# Patient Record
Sex: Female | Born: 1955 | Race: Black or African American | Hispanic: No | Marital: Single | Smoking: Former smoker
Health system: Southern US, Community
[De-identification: ages and names within clinical notes are randomized; demographics above are authoritative.]

## PROBLEM LIST (undated history)

## (undated) DIAGNOSIS — E785 Hyperlipidemia, unspecified: Secondary | ICD-10-CM

## (undated) HISTORY — PX: CARDIAC CATHETERIZATION: SHX172

## (undated) HISTORY — PX: MOUTH SURGERY: SHX715

---

## 1999-05-19 ENCOUNTER — Encounter: Payer: Self-pay | Admitting: Emergency Medicine

## 1999-05-19 ENCOUNTER — Emergency Department (HOSPITAL_COMMUNITY): Admission: EM | Admit: 1999-05-19 | Discharge: 1999-05-19 | Payer: Self-pay | Admitting: Emergency Medicine

## 1999-05-25 ENCOUNTER — Encounter: Payer: Self-pay | Admitting: Neurology

## 1999-05-25 ENCOUNTER — Ambulatory Visit (HOSPITAL_COMMUNITY): Admission: RE | Admit: 1999-05-25 | Discharge: 1999-05-25 | Payer: Self-pay | Admitting: Neurology

## 1999-06-10 ENCOUNTER — Encounter: Payer: Self-pay | Admitting: Neurology

## 1999-06-10 ENCOUNTER — Ambulatory Visit (HOSPITAL_COMMUNITY): Admission: RE | Admit: 1999-06-10 | Discharge: 1999-06-10 | Payer: Self-pay | Admitting: Neurology

## 2007-10-13 ENCOUNTER — Ambulatory Visit: Payer: Self-pay | Admitting: Internal Medicine

## 2007-10-14 ENCOUNTER — Ambulatory Visit (HOSPITAL_COMMUNITY): Admission: EM | Admit: 2007-10-14 | Discharge: 2007-10-14 | Payer: Self-pay | Admitting: Emergency Medicine

## 2010-08-04 NOTE — Op Note (Signed)
NAMEJIL, Rachel Gutierrez               ACCOUNT NO.:  [id number]   MEDICAL RECORD NO.:  [id number]          PATIENT TYPE:  AMB   LOCATION:  DAY                           FACILITY:  [facility]   PHYSICIAN:  R. Roetta Sessions, M.D. DATE OF BIRTH:  1955/08/29   DATE OF PROCEDURE:  10/14/2007  DATE OF DISCHARGE:                               OPERATIVE REPORT   INDICATIONS FOR PROCEDURE:  A 55 year old lady with no prior GI history  or symptoms came to the ED to see Dr. Colon Gutierrez this afternoon.  After  eating a salad this morning just finishing her meal noted that one of  the prongs of her plastic fork was missing from the base (this could be  about a 3 cm in length prong).  Although she did not perceive that she  swallowed a foreign body, she certainly could not find the prong  anywhere and she started her meal with an intact fork.  She said some  time after that she began feeling a foreign body sensation pointing to  the base of her neck just below to her suprasternal notch.  She states  its sensations moved around somewhat in this area from left-to-right  during her ED stay.  She was given crackers in the ED.  She has been  able to swallow crackers okay and water.  Plain film of the neck was  negative.  Dr. Colon Gutierrez had recommended a conservative approach.  The  patient was very concerned that she might have a piece of the fork  lodged in her throat.  I came to see Rachel Gutierrez and told her that it  sounds like she may or may not have ingested a piece of this fork and  then she has foreign body sensation.  If we perform an upper endoscopy,  we may not find it, but I have offered her this approach.  Risks,  benefits, and alternatives have been reviewed and questions answered.  She is agreeable.   PROCEDURE NOTE:  O2 saturation, blood pressure, pulse, and respirations  monitored throughout the entire procedure.   CONSCIOUS SEDATION:  Versed 4 mg IV and Demerol 100 mg IV in divided  doses. Cetacaine  spray for topical pharyngeal anesthesia.   INSTRUMENT:  Pentax video chip system.   FINDINGS:  Examination of the hypopharynx:  I got a good look around the  uvula down the piriform sinuses.  There was no foreign body.  This had  been a black prong from a black fork and would have a shown it very  readily.  I did not even seen any evidence of any mucosal trauma.  The  esophagus was easily intubated.  The esophageal mucosa was surveyed.  The patient had a noncritical Schatzki ring, otherwise normal esophagus.  EG junction easily traversed.   Stomach:  Gastric cavity was almost emptied, there was a little mucus  present and a slight amount of vegetable material, otherwise.  It was  emptied and insufflated well with air.  This minimal vegetable material  and mucus was easily washed away.  Thorough examination of gastric  mucosa  including retroflexed view of the proximal stomach and  esophagogastric junction demonstrated only a small hiatal hernia.  Pylorus was patent.  I examined the bulb, the second, and well into the  third portion of duodenum.  I found normal mucosa and no evidence of  foreign body anywhere.   THERAPEUTIC/DIAGNOSTIC MANEUVERS PERFORMED:  None.   The patient tolerated the procedure well and was reactive in Endoscopy.  I did take a photograph of the fork she brought in, indicating a missing  prong with the gastroscope.   IMPRESSION:  1. Normal hypopharynx, noncritical Schatzki ring (not manipulated      because the patient does not have dysphagia), otherwise normal      esophagus, small hiatal hernia.  Otherwise, normal stomach, D1, D2,      and D3.  At this point, the patient may or may not have ingested a      foreign body.  If she does have a fragment of fork prong, it is      well into her small intestine.  At this point, recommendations      hopefully if she has a fork fragment in her small, it will pass      uneventfully.  There is really nothing else to be  done at this      point in time as long she does not have any future symptoms.      Recommendations, would go home and take it easy and would gradually      resume regular diet later today.  2. At a totally separate issue note, Rachel Gutierrez has never had      colorectal cancer screening.  She is 52.  I have recommended she      consider having a screening colonoscopy between now and the end of      the year and she could call my office, I would be glad to set that      up.      Jonathon Bellows, M.D.  Electronically Signed     RMR/MEDQ  D:  10/14/2007  T:  10/14/2007  Job:  [id number]   cc:   Rachel Gutierrez, M.D.  Fax: [phone]

## 2012-09-11 ENCOUNTER — Ambulatory Visit (HOSPITAL_COMMUNITY)
Admission: RE | Admit: 2012-09-11 | Discharge: 2012-09-11 | Disposition: A | Discharge status: Home / Self Care | Payer: PRIVATE HEALTH INSURANCE | Source: Ambulatory Visit | Attending: Family Medicine | Admitting: Family Medicine

## 2012-09-11 ENCOUNTER — Other Ambulatory Visit (HOSPITAL_COMMUNITY): Payer: Self-pay | Admitting: Family Medicine

## 2012-09-11 DIAGNOSIS — Z139 Encounter for screening, unspecified: Secondary | ICD-10-CM

## 2012-09-11 DIAGNOSIS — Z1231 Encounter for screening mammogram for malignant neoplasm of breast: Secondary | ICD-10-CM | POA: Insufficient documentation

## 2012-09-12 ENCOUNTER — Other Ambulatory Visit: Payer: Self-pay | Admitting: Family Medicine

## 2012-09-12 DIAGNOSIS — R928 Other abnormal and inconclusive findings on diagnostic imaging of breast: Secondary | ICD-10-CM

## 2012-10-11 ENCOUNTER — Other Ambulatory Visit: Payer: Self-pay

## 2012-10-11 ENCOUNTER — Telehealth: Payer: Self-pay

## 2012-10-11 DIAGNOSIS — Z1211 Encounter for screening for malignant neoplasm of colon: Secondary | ICD-10-CM

## 2012-10-18 NOTE — Telephone Encounter (Signed)
Gastroenterology Pre-Procedure Review  Request Date: 10/17/2012 Requesting Physician: Dr. Janna Arch  PATIENT REVIEW QUESTIONS: The patient responded to the following health history questions as indicated:    1. Diabetes Melitis: no 2. Joint replacements in the past 12 months: no 3. Major health problems in the past 3 months: no 4. Has an artificial valve or MVP: no 5. Has a defibrillator: no 6. Has been advised in past to take antibiotics in advance of a procedure like teeth cleaning: no    MEDICATIONS & ALLERGIES:    Patient reports the following regarding taking any blood thinners:   Plavix? no Aspirin? no Coumadin? no  Patient confirms/reports the following medications:  Current Outpatient Prescriptions  Medication Sig Dispense Refill  . diphenhydrAMINE (BENADRYL) 25 MG tablet Take 25 mg by mouth every 6 (six) hours as needed for itching.      . pravastatin (PRAVACHOL) 40 MG tablet Take 40 mg by mouth daily.       No current facility-administered medications for this visit.    Patient confirms/reports the following allergies:  No Known Allergies  No orders of the defined types were placed in this encounter.    AUTHORIZATION INFORMATION Primary Insurance:   ID #:   Group #:  Pre-Cert / Auth required:  Pre-Cert / Auth #:   Secondary Insurance:   ID #:   Group #:  Pre-Cert / Auth required:  Pre-Cert / Auth #:   SCHEDULE INFORMATION: Procedure has been scheduled as follows:  Date: 11/06/2012        Time:  7:30 AM Location: [facility] Short Stay  This Gastroenterology Pre-Precedure Review Form is being routed to the following provider(s): R. Roetta Sessions, MD

## 2012-10-19 NOTE — Telephone Encounter (Signed)
Appropriate.

## 2012-10-20 ENCOUNTER — Encounter (HOSPITAL_COMMUNITY): Payer: Self-pay

## 2012-10-23 MED ORDER — PEG-KCL-NACL-NASULF-NA ASC-C 100 G PO SOLR: 1.0000 | ORAL | Status: DC

## 2012-10-23 NOTE — Telephone Encounter (Signed)
Rx sent to the pharmacy and instructions mailed to pt.  

## 2012-10-30 ENCOUNTER — Telehealth: Payer: Self-pay

## 2012-10-30 NOTE — Telephone Encounter (Signed)
Called [employer] at [phone] and spoke to [employer] who said that pre-cert is required. The PA # [id number]. This is for TCS scheduled with RMR on 11/06/2012 at 7:30 AM. ( she did say that if the date changes that we will have to call them back and get new number.)

## 2012-11-06 ENCOUNTER — Ambulatory Visit (HOSPITAL_COMMUNITY)
Admission: RE | Admit: 2012-11-06 | Discharge: 2012-11-06 | Disposition: A | Discharge status: Home / Self Care | Payer: PRIVATE HEALTH INSURANCE | Source: Ambulatory Visit | Attending: Internal Medicine | Admitting: Internal Medicine

## 2012-11-06 ENCOUNTER — Encounter (HOSPITAL_COMMUNITY)
Admission: RE | Disposition: A | Discharge status: Home / Self Care | Payer: Self-pay | Source: Ambulatory Visit | Attending: Internal Medicine

## 2012-11-06 ENCOUNTER — Encounter (HOSPITAL_COMMUNITY): Payer: Self-pay | Admitting: *Deleted

## 2012-11-06 DIAGNOSIS — Z1211 Encounter for screening for malignant neoplasm of colon: Secondary | ICD-10-CM

## 2012-11-06 DIAGNOSIS — D126 Benign neoplasm of colon, unspecified: Secondary | ICD-10-CM | POA: Insufficient documentation

## 2012-11-06 DIAGNOSIS — E785 Hyperlipidemia, unspecified: Secondary | ICD-10-CM | POA: Insufficient documentation

## 2012-11-06 DIAGNOSIS — Z87891 Personal history of nicotine dependence: Secondary | ICD-10-CM | POA: Insufficient documentation

## 2012-11-06 DIAGNOSIS — K5730 Diverticulosis of large intestine without perforation or abscess without bleeding: Secondary | ICD-10-CM | POA: Insufficient documentation

## 2012-11-06 DIAGNOSIS — Z79899 Other long term (current) drug therapy: Secondary | ICD-10-CM | POA: Insufficient documentation

## 2012-11-06 HISTORY — DX: Hyperlipidemia, unspecified: E78.5

## 2012-11-06 HISTORY — PX: COLONOSCOPY: SHX5424

## 2012-11-06 SURGERY — COLONOSCOPY
Anesthesia: Moderate Sedation

## 2012-11-06 MED ORDER — MIDAZOLAM HCL 5 MG/5ML IJ SOLN
INTRAMUSCULAR | Status: AC
  Filled 2012-11-06: qty 10

## 2012-11-06 MED ORDER — MEPERIDINE HCL 100 MG/ML IJ SOLN
INTRAMUSCULAR | Status: AC
  Filled 2012-11-06: qty 2

## 2012-11-06 MED ORDER — STERILE WATER FOR IRRIGATION IR SOLN
Status: DC | PRN
  Administered 2012-11-06: 08:00:00

## 2012-11-06 MED ORDER — MEPERIDINE HCL 100 MG/ML IJ SOLN
INTRAMUSCULAR | Status: DC | PRN
  Administered 2012-11-06: 50 mg via INTRAVENOUS
  Administered 2012-11-06: 25 mg via INTRAVENOUS

## 2012-11-06 MED ORDER — ONDANSETRON HCL 4 MG/2ML IJ SOLN
INTRAMUSCULAR | Status: AC
  Filled 2012-11-06: qty 2

## 2012-11-06 MED ORDER — SODIUM CHLORIDE 0.9 % IV SOLN
INTRAVENOUS | Status: DC
  Administered 2012-11-06: 1000 mL via INTRAVENOUS

## 2012-11-06 MED ORDER — MIDAZOLAM HCL 5 MG/5ML IJ SOLN
INTRAMUSCULAR | Status: DC | PRN
  Administered 2012-11-06: 2 mg via INTRAVENOUS
  Administered 2012-11-06: 1 mg via INTRAVENOUS

## 2012-11-06 MED ORDER — ONDANSETRON HCL 4 MG/2ML IJ SOLN
INTRAMUSCULAR | Status: DC | PRN
  Administered 2012-11-06: 4 mg via INTRAVENOUS

## 2012-11-06 NOTE — H&P (Signed)
Primary Care Physician:  Isabella Stalling, MD Primary Gastroenterologist:  Dr. Jena Gauss  Pre-Procedure History & Physical: HPI:  Rachel Gutierrez is a 57 y.o. female is here for a screening colonoscopy. No bowel symptoms. No prior colonoscopy. No family history colon polyps or colon cancer.  Past Medical History  Diagnosis Date  . Hyperlipemia     Past Surgical History  Procedure Laterality Date  . Mouth surgery      30 yrs ago  . Cardiac catheterization      20 yrs ago    Prior to Admission medications   Medication Sig Start Date End Date Taking? Authorizing Provider  peg 3350 powder (MOVIPREP) 100 G SOLR Take 1 kit (200 g total) by mouth as directed. 10/23/12  Yes Corbin Ade, MD  pravastatin (PRAVACHOL) 40 MG tablet Take 40 mg by mouth daily.   Yes Historical Provider, MD  diphenhydrAMINE (BENADRYL) 25 MG tablet Take 25 mg by mouth daily as needed for allergies.     Historical Provider, MD    Allergies as of 10/11/2012  . (Not on File)    Family History  Problem Relation Age of Onset  . Cancer - Other Mother   . Thyroid disease Other   . Stroke Maternal Uncle   . CAD Maternal Uncle     History   Social History  . Marital Status: Single    Spouse Name: N/A    Number of Children: N/A  . Years of Education: N/A   Occupational History  . Not on file.   Social History Main Topics  . Smoking status: Former Smoker -- 0.25 packs/day for 2 years    Types: Cigarettes    Quit date: 03/22/1978  . Smokeless tobacco: Never Used  . Alcohol Use: Yes     Comment: social  . Drug Use: No  . Sexual Activity: Not Currently   Other Topics Concern  . Not on file   Social History Narrative  . No narrative on file    Review of Systems: See HPI, otherwise negative ROS  Physical Exam: BP 117/74  Pulse 75  Temp(Src) 98.5 F (36.9 C) (Oral)  Resp 20  Ht 5\' 5"  (1.651 m)  Wt 180 lb (81.647 kg)  BMI 29.95 kg/m2  SpO2 93% General:   Alert,  Well-developed,  well-nourished, pleasant and cooperative in NAD Head:  Normocephalic and atraumatic. Eyes:  Sclera clear, no icterus.   Conjunctiva pink. Ears:  Normal auditory acuity. Nose:  No deformity, discharge,  or lesions. Mouth:  No deformity or lesions, dentition normal. Neck:  Supple; no masses or thyromegaly. Lungs:  Clear throughout to auscultation.   No wheezes, crackles, or rhonchi. No acute distress. Heart:  Regular rate and rhythm; no murmurs, clicks, rubs,  or gallops. Abdomen:  Soft, nontender and nondistended. No masses, hepatosplenomegaly or hernias noted. Normal bowel sounds, without guarding, and without rebound.   Msk:  Symmetrical without gross deformities. Normal posture. Pulses:  Normal pulses noted. Extremities:  Without clubbing or edema. Neurologic:  Alert and  oriented x4;  grossly normal neurologically. Skin:  Intact without significant lesions or rashes. Cervical Nodes:  No significant cervical adenopathy. Psych:  Alert and cooperative. Normal mood and affect.  Impression/Plan: Rachel Gutierrez is now here to undergo a screening colonoscopy.  First-ever average risk screening examination  Risks, benefits, limitations, imponderables and alternatives regarding colonoscopy have been reviewed with the patient. Questions have been answered. All parties agreeable.

## 2012-11-06 NOTE — Op Note (Signed)
[facility] [address] [place], [address]   COLONOSCOPY PROCEDURE REPORT  PATIENT: Rachel, Gutierrez  MR#:         [id number] BIRTHDATE: [birth year] , 57  yrs. old GENDER: Female ENDOSCOPIST: R.  Roetta Sessions, MD FACP [facility] REFERRED BY:  Oval Linsey, M.D. PROCEDURE DATE:  11/06/2012 PROCEDURE:     Colonoscopy with snare polypectomy INDICATIONS: First-ever average risk screening colonoscopy  INFORMED CONSENT:  The risks, benefits, alternatives and imponderables including but not limited to bleeding, perforation as well as the possibility of a missed lesion have been reviewed.  The potential for biopsy, lesion removal, etc. have also been discussed.  Questions have been answered.  All parties agreeable. Please see the history and physical in the medical record for more information.  MEDICATIONS: Versed 3 mg IV and Demerol 75 mg IV in divided doses. Zofran 4 mg  DESCRIPTION OF PROCEDURE:  After a digital rectal exam was performed, the EC-3890Li ([id number])  colonoscope was advanced from the anus through the rectum and colon to the area of the cecum, ileocecal valve and appendiceal orifice.  The cecum was deeply intubated.  These structures were well-seen and photographed for the record.  From the level of the cecum and ileocecal valve, the scope was slowly and cautiously withdrawn.  The mucosal surfaces were carefully surveyed utilizing scope tip deflection to facilitate fold flattening as needed.  The scope was pulled down into the rectum where a thorough examination including retroflexion was performed.    FINDINGS:  Adequate preparation. Normal rectum. Scattered left-sided diverticula; (1)  7x7 mm area of serrated, polypoid mucosal on the distal side of the ileocecal valve; otherwise, the remainder of colonic mucosa appeared normal.  THERAPEUTIC / DIAGNOSTIC MANEUVERS PERFORMED:  The above-mentioned polyp was removed with hot snare cautery  technique  COMPLICATIONS: None  CECAL WITHDRAWAL TIME:  13 minutes  IMPRESSION:  polyp-ileocecal valve-removed as described above. Colonic diverticulosis  RECOMMENDATIONS: Further recommendations to follow pending review of pathology report   _______________________________ eSigned:  R. Roetta Sessions, MD FACP [facility] 11/06/2012 8:18 AM   CC:

## 2012-11-07 ENCOUNTER — Encounter (HOSPITAL_COMMUNITY): Payer: Self-pay | Admitting: Internal Medicine

## 2012-11-07 ENCOUNTER — Encounter: Payer: Self-pay | Admitting: Internal Medicine

## 2015-09-29 ENCOUNTER — Encounter: Payer: Self-pay | Admitting: Internal Medicine

## 2017-01-12 ENCOUNTER — Other Ambulatory Visit: Payer: Self-pay | Admitting: Family Medicine

## 2017-01-12 DIAGNOSIS — Z1231 Encounter for screening mammogram for malignant neoplasm of breast: Secondary | ICD-10-CM

## 2017-01-14 ENCOUNTER — Ambulatory Visit (HOSPITAL_COMMUNITY)
Admission: RE | Admit: 2017-01-14 | Discharge: 2017-01-14 | Disposition: A | Discharge status: Home / Self Care | Payer: PRIVATE HEALTH INSURANCE | Source: Ambulatory Visit | Attending: Family Medicine | Admitting: Family Medicine

## 2017-01-14 DIAGNOSIS — Z1231 Encounter for screening mammogram for malignant neoplasm of breast: Secondary | ICD-10-CM | POA: Insufficient documentation

## 2017-01-31 ENCOUNTER — Encounter: Payer: Self-pay | Admitting: Family Medicine

## 2017-01-31 ENCOUNTER — Other Ambulatory Visit: Payer: Self-pay

## 2017-01-31 ENCOUNTER — Ambulatory Visit: Payer: PRIVATE HEALTH INSURANCE | Admitting: Family Medicine

## 2017-01-31 VITALS — BP 132/80 | HR 100 | Temp 97.8°F | Resp 16 | Ht 65.0 in | Wt 194.1 lb

## 2017-01-31 DIAGNOSIS — E559 Vitamin D deficiency, unspecified: Secondary | ICD-10-CM

## 2017-01-31 DIAGNOSIS — E785 Hyperlipidemia, unspecified: Secondary | ICD-10-CM | POA: Insufficient documentation

## 2017-01-31 DIAGNOSIS — Z1159 Encounter for screening for other viral diseases: Secondary | ICD-10-CM | POA: Diagnosis not present

## 2017-01-31 DIAGNOSIS — E663 Overweight: Secondary | ICD-10-CM | POA: Diagnosis not present

## 2017-01-31 DIAGNOSIS — D126 Benign neoplasm of colon, unspecified: Secondary | ICD-10-CM | POA: Diagnosis not present

## 2017-01-31 NOTE — Patient Instructions (Addendum)
Need blood tests Need old records Continue good diet and exercise habits  Come back for a PAP and PE next month  work note

## 2017-01-31 NOTE — Progress Notes (Signed)
Chief Complaint  Patient presents with  . Follow-up    est. care  Patient is new to establish.  She states she has no ongoing medical problems. She had a history of hyperlipidemia and previously took pravastatin.  She took herself off of this medication because she did not like taking it.  She does not recall that she had any difficulty with the medication.  She does not recall when her lipids were last checked. No hypertension or heart disease.  She did have a cardiac catheterization one time she states for "fainting spell".  She tells me that it was normal She is on no prescription medicine.  She takes no nutritional supplements or vitamins. She is up-to-date with her mammogram.  She has not had a Pap smear in many years.  She agrees that it is time to get this done.  She is not currently sexually active. She had a colonoscopy in 2014.  She had an adenomatous polyp.  She is due again in 5 years. We discussed the need for hepatitis C screening and all persons born between [place].  She is low risk but agrees to have this done. She refuses a flu shot.  She cares for an aging relative.  I discussed with her the importance of getting a flu shot both to protect herself and her left lungs.  She will "think about it".  She states her tetanus is up-to-date.    Patient Active Problem List   Diagnosis Date Noted  . HLD (hyperlipidemia) 01/31/2017  . Overweight (BMI 25.0-29.9) 01/31/2017  . Colon adenoma 01/31/2017  . Encounter for hepatitis C screening test for low risk patient 01/31/2017    Outpatient Encounter Medications as of 01/31/2017  None No facility-administered encounter medications on file as of 01/31/2017.     Past Medical History:  Diagnosis Date  . Hyperlipemia     Past Surgical History:  Procedure Laterality Date  . CARDIAC CATHETERIZATION     20 yrs ago  . MOUTH SURGERY     30 yrs ago    Social History   Socioeconomic History  . Marital status: Single   Spouse name: Not on file  . Number of children: 0  . Years of education: 89  . Highest education level: Bachelor's degree (e.g., BA, AB, BS)  Social Needs  . Financial resource strain: Not hard at all  . Food insecurity - worry: Never true  . Food insecurity - inability: Never true  . Transportation needs - medical: No  . Transportation needs - non-medical: No  Occupational History  . Occupation: Estate manager/land agent  Tobacco Use  . Smoking status: Former Smoker    Packs/day: 0.25    Years: 2.00    Pack years: 0.50    Types: Cigarettes    Last attempt to quit: 03/22/1978    Years since quitting: 38.8  . Smokeless tobacco: Never Used  Substance and Sexual Activity  . Alcohol use: Yes    Comment: social  . Drug use: No  . Sexual activity: Not Currently  Other Topics Concern  . 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 Cancer Mother        pancreatic  . Heart disease Father   . Thyroid disease Other   .  Stroke Maternal Uncle   . CAD Maternal Uncle   . Cancer Maternal Uncle        esophageal    Review of Systems  Constitutional: Negative for chills, fever and weight loss.  HENT: Negative for congestion and hearing loss.   Eyes: Negative for blurred vision and pain.  Respiratory: Negative for cough and shortness of breath.   Cardiovascular: Negative for chest pain and leg swelling.  Gastrointestinal: Negative for abdominal pain, constipation, diarrhea and heartburn.  Genitourinary: Negative for dysuria and frequency.  Musculoskeletal: Negative for falls, joint pain and myalgias.  Neurological: Negative for dizziness, seizures and headaches.  Psychiatric/Behavioral: Negative for depression. The patient is not nervous/anxious and does not have insomnia.     BP 132/80   Pulse 100   Temp 97.8 F (36.6 C)  (Temporal)   Resp 16   Ht 5\' 5"  (1.651 m)   Wt 194 lb 1.9 oz (88.1 kg)   SpO2 97%   BMI 32.30 kg/m   Physical Exam  Constitutional: She is oriented to person, place, and time. She appears well-developed and well-nourished.  HENT:  Head: Normocephalic and atraumatic.  Mouth/Throat: Oropharynx is clear and moist.  Eyes: Conjunctivae are normal. Pupils are equal, round, and reactive to light.  Neck: Normal range of motion. Neck supple. No thyromegaly present.  Cardiovascular: Normal rate, regular rhythm and normal heart sounds.  Pulmonary/Chest: Effort normal and breath sounds normal. No respiratory distress.  Abdominal: Soft. Bowel sounds are normal.  Musculoskeletal: Normal range of motion. She exhibits no edema.  Lymphadenopathy:    She has no cervical adenopathy.  Neurological: She is alert and oriented to person, place, and time.  Gait normal  Skin: Skin is warm and dry.  Psychiatric: She has a normal mood and affect. Her behavior is normal. Thought content normal.  Nursing note and vitals reviewed.  ASSESSMENT/PLAN:  1. Hyperlipidemia, unspecified hyperlipidemia type By history.  Will check lipid panel - CBC - Comprehensive metabolic panel - Lipid panel - Urinalysis, Routine w reflex microscopic - Hepatitis C antibody  2. Overweight (BMI 25.0-29.9) Discussed diet, exercise recommendations  3. Colon adenoma By history.  4. Encounter for hepatitis C screening test for low risk patient Low risk - Hepatitis C antibody  5. Vitamin D deficiency By history.  Will recheck level - VITAMIN D 25 Hydroxy (Vit-D Deficiency, Fractures)   Patient Instructions  Need blood tests Need old records Continue good diet and exercise habits  Come back for a PAP and PE next month  work note   Raylene Everts, MD

## 2017-02-01 LAB — LIPID PANEL
Cholesterol: 223 mg/dL — ABNORMAL HIGH (ref <200)
HDL: 52 mg/dL (ref >50)
LDL CHOLESTEROL (CALC): 149 mg/dL — AB
NON-HDL CHOLESTEROL (CALC): 171 mg/dL — AB (ref <130)
TRIGLYCERIDES: 104 mg/dL (ref <150)
Total CHOL/HDL Ratio: 4.3 (calc) (ref <5.0)

## 2017-02-01 LAB — CBC
HCT: 39.3 % (ref 35.0–45.0)
Hemoglobin: 13.6 g/dL (ref 11.7–15.5)
MCH: 29.8 pg (ref 27.0–33.0)
MCHC: 34.6 g/dL (ref 32.0–36.0)
MCV: 86 fL (ref 80.0–100.0)
MPV: 10.2 fL (ref 7.5–12.5)
Platelets: 400 10*3/uL (ref 140–400)
RBC: 4.57 10*6/uL (ref 3.80–5.10)
RDW: 12.9 % (ref 11.0–15.0)
WBC: 6.4 10*3/uL (ref 3.8–10.8)

## 2017-02-01 LAB — COMPREHENSIVE METABOLIC PANEL
AG RATIO: 1.2 (calc) (ref 1.0–2.5)
ALBUMIN MSPROF: 4.3 g/dL (ref 3.6–5.1)
ALT: 30 U/L — AB (ref 6–29)
AST: 17 U/L (ref 10–35)
Alkaline phosphatase (APISO): 84 U/L (ref 33–130)
BUN: 12 mg/dL (ref 7–25)
CO2: 29 mmol/L (ref 20–32)
Calcium: 9.9 mg/dL (ref 8.6–10.4)
Chloride: 104 mmol/L (ref 98–110)
Creat: 0.81 mg/dL (ref 0.50–1.05)
GLOBULIN: 3.5 g/dL (ref 1.9–3.7)
GLUCOSE: 107 mg/dL — AB (ref 65–99)
POTASSIUM: 4.6 mmol/L (ref 3.5–5.3)
SODIUM: 140 mmol/L (ref 135–146)
TOTAL PROTEIN: 7.8 g/dL (ref 6.1–8.1)
Total Bilirubin: 0.4 mg/dL (ref 0.2–1.2)

## 2017-02-01 LAB — URINALYSIS, ROUTINE W REFLEX MICROSCOPIC
Bilirubin Urine: NEGATIVE
Glucose, UA: NEGATIVE
Hgb urine dipstick: NEGATIVE
KETONES UR: NEGATIVE
Leukocytes, UA: NEGATIVE
NITRITE: NEGATIVE
PH: 5.5 (ref 5.0–8.0)
Protein, ur: NEGATIVE
SPECIFIC GRAVITY, URINE: 1.021 (ref 1.001–1.03)

## 2017-02-01 LAB — HEPATITIS C ANTIBODY
Hepatitis C Ab: NONREACTIVE
SIGNAL TO CUT-OFF: 0.04 (ref <1.00)

## 2017-02-01 LAB — VITAMIN D 25 HYDROXY (VIT D DEFICIENCY, FRACTURES): Vit D, 25-Hydroxy: 15 ng/mL — ABNORMAL LOW (ref 30–100)

## 2017-02-03 ENCOUNTER — Encounter: Payer: Self-pay | Admitting: Family Medicine

## 2017-02-14 ENCOUNTER — Other Ambulatory Visit (HOSPITAL_COMMUNITY)
Admission: RE | Admit: 2017-02-14 | Discharge: 2017-02-14 | Disposition: A | Discharge status: Home / Self Care | Payer: PRIVATE HEALTH INSURANCE | Source: Ambulatory Visit | Attending: Family Medicine | Admitting: Family Medicine

## 2017-02-14 ENCOUNTER — Ambulatory Visit (INDEPENDENT_AMBULATORY_CARE_PROVIDER_SITE_OTHER): Payer: PRIVATE HEALTH INSURANCE | Admitting: Family Medicine

## 2017-02-14 ENCOUNTER — Other Ambulatory Visit: Payer: Self-pay

## 2017-02-14 ENCOUNTER — Encounter: Payer: Self-pay | Admitting: Family Medicine

## 2017-02-14 VITALS — BP 132/82 | HR 84 | Temp 97.9°F | Resp 18 | Ht 65.0 in | Wt 195.0 lb

## 2017-02-14 DIAGNOSIS — Z0000 Encounter for general adult medical examination without abnormal findings: Secondary | ICD-10-CM

## 2017-02-14 DIAGNOSIS — Z1159 Encounter for screening for other viral diseases: Secondary | ICD-10-CM | POA: Diagnosis not present

## 2017-02-14 DIAGNOSIS — E785 Hyperlipidemia, unspecified: Secondary | ICD-10-CM | POA: Diagnosis not present

## 2017-02-14 DIAGNOSIS — L918 Other hypertrophic disorders of the skin: Secondary | ICD-10-CM | POA: Diagnosis not present

## 2017-02-14 DIAGNOSIS — E559 Vitamin D deficiency, unspecified: Secondary | ICD-10-CM | POA: Insufficient documentation

## 2017-02-14 DIAGNOSIS — Z6832 Body mass index (BMI) 32.0-32.9, adult: Secondary | ICD-10-CM | POA: Insufficient documentation

## 2017-02-14 DIAGNOSIS — Z124 Encounter for screening for malignant neoplasm of cervix: Secondary | ICD-10-CM | POA: Diagnosis not present

## 2017-02-14 DIAGNOSIS — E663 Overweight: Secondary | ICD-10-CM | POA: Insufficient documentation

## 2017-02-14 DIAGNOSIS — Z1151 Encounter for screening for human papillomavirus (HPV): Secondary | ICD-10-CM | POA: Insufficient documentation

## 2017-02-14 NOTE — Progress Notes (Signed)
Chief Complaint  Patient presents with  . Annual Exam   Patient is here for an annual physical examination. She has no acute complaints today.  Last Pap was several years ago it was normal.  She is getting a physical and Pap today. We reviewed her most recent blood work. She has mild hyperlipidemia.  She has vitamin D deficiency. Diet and exercise are reviewed.  Vitamin D supplementation is discussed.  Dietary sources of vitamin D and cholesterol are reviewed.  Regular exercise is recommended. Mammogram is up-to-date.  Colonoscopy is up-to-date. We discussed the CDC recommendations for hepatitis C screening.  She desires screening.  She is a low risk individual.   Patient Active Problem List   Diagnosis Date Noted  . HLD (hyperlipidemia) 01/31/2017  . Overweight (BMI 25.0-29.9) 01/31/2017  . Colon adenoma 01/31/2017  . Encounter for hepatitis C screening test for low risk patient 01/31/2017    No outpatient encounter medications on file as of 02/14/2017.   No facility-administered encounter medications on file as of 02/14/2017.   No prescription medications  No Known Allergies  Review of Systems  Constitutional: Negative for activity change, appetite change and unexpected weight change.  HENT: Negative for congestion, dental problem, postnasal drip and rhinorrhea.   Eyes: Negative for redness and visual disturbance.  Respiratory: Negative for cough and shortness of breath.   Cardiovascular: Negative for chest pain, palpitations and leg swelling.  Gastrointestinal: Negative for abdominal pain, constipation and diarrhea.  Genitourinary: Negative for difficulty urinating, frequency and vaginal bleeding.  Musculoskeletal: Negative for arthralgias and back pain.  Neurological: Negative for dizziness and headaches.  Psychiatric/Behavioral: Negative for dysphoric mood and sleep disturbance. The patient is not nervous/anxious.     BP 132/82 (BP Location: Right Arm, Patient  Position: Sitting, Cuff Size: Normal)   Pulse 84   Temp 97.9 F (36.6 C) (Temporal)   Resp 18   Ht 5\' 5"  (1.651 m)   Wt 195 lb 0.6 oz (88.5 kg)   SpO2 95%   BMI 32.46 kg/m    Physical Exam BP 132/82 (BP Location: Right Arm, Patient Position: Sitting, Cuff Size: Normal)   Pulse 84   Temp 97.9 F (36.6 C) (Temporal)   Resp 18   Ht 5\' 5"  (1.651 m)   Wt 195 lb 0.6 oz (88.5 kg)   SpO2 95%   BMI 32.46 kg/m    General Appearance:    Alert, cooperative, no distress, appears stated age moderately obese  Head:    Normocephalic, without obvious abnormality, atraumatic  Eyes:    PERRL, conjunctiva/corneas clear, EOM's intact, fundi    benign, both eyes  Ears:    Normal TM's and external ear canals, both ears  Nose:   Nares normal, septum midline, mucosa normal, no drainage    or sinus tenderness  Throat:   Lips, mucosa, and tongue normal; teeth and gums normal  Neck:   Supple, symmetrical, trachea midline, no adenopathy;    thyroid:  no enlargement/tenderness/nodules; no carotid   bruit , ring of skin tags around neck  Back:     Symmetric, no curvature, ROM normal, no CVA tenderness  Lungs:     Clear to auscultation bilaterally, respirations unlabored  Chest Wall:    No tenderness or deformity   Heart:    Regular rate and rhythm, S1 and S2 normal, no murmur, rub   or gallop  Breast Exam:    No tenderness, masses, or nipple abnormality  Abdomen:  Soft, non-tender, bowel sounds active all four quadrants,    no masses, no organomegaly  Genitalia:    Normal female without lesion, discharge or tenderness.  Cervix appears normal.  Vagina mildly atrophic.  Pap is obtained.  Bimanual is negative.  Rectal:   Deferred  Extremities:   Extremities normal, atraumatic, no cyanosis or edema  Pulses:   2+ and symmetric all extremities  Skin:   Skin color, texture, turgor normal, no rashes or lesions  Lymph nodes:   Cervical, supraclavicular, and axillary nodes normal  Neurologic:    Normal  strength, sensation and reflexes    throughout    ASSESSMENT/PLAN:  1. Annual visit for general adult medical examination without abnormal findings Annual physical examination  2. Vitamin D deficiency Discussed.  Patient prefers to try to improve vitamin D through diet and sunshine.  Will get vitamin D level in 6 months  3. Encounter for hepatitis C screening test for low risk patient Discussed  4. Hyperlipidemia, unspecified hyperlipidemia type Discussed.  Again she wants to try diet and exercise.  Recheck in 6 months   Patient Instructions  I will send you a letter with your test results.  If there is anything of concern, we will call right away. Take the adult women's vitamin with D  Reduce cholesterol in your diet walk every day that you are able  See me in six months Labs prior to visit   Raylene Everts, MD

## 2017-02-14 NOTE — Patient Instructions (Addendum)
I will send you a letter with your test results.  If there is anything of concern, we will call right away. Take the adult women's vitamin with D  Reduce cholesterol in your diet walk every day that you are able  See me in six months Labs prior to visit

## 2017-02-16 LAB — CYTOLOGY - PAP
Diagnosis: NEGATIVE
HPV (WINDOPATH): NOT DETECTED

## 2017-02-22 ENCOUNTER — Ambulatory Visit (INDEPENDENT_AMBULATORY_CARE_PROVIDER_SITE_OTHER): Payer: PRIVATE HEALTH INSURANCE | Admitting: Gastroenterology

## 2017-02-22 ENCOUNTER — Encounter: Payer: Self-pay | Admitting: Family Medicine

## 2017-02-22 ENCOUNTER — Other Ambulatory Visit: Payer: Self-pay | Admitting: *Deleted

## 2017-02-22 ENCOUNTER — Encounter: Payer: Self-pay | Admitting: *Deleted

## 2017-02-22 ENCOUNTER — Encounter: Payer: Self-pay | Admitting: Gastroenterology

## 2017-02-22 VITALS — BP 133/85 | HR 77 | Temp 98.1°F | Ht 65.0 in | Wt 192.4 lb

## 2017-02-22 DIAGNOSIS — D126 Benign neoplasm of colon, unspecified: Secondary | ICD-10-CM

## 2017-02-22 MED ORDER — PEG 3350-KCL-NA BICARB-NACL 420 G PO SOLR: 4000.0000 mL | Freq: Once | ORAL | 0 refills | Status: AC

## 2017-02-22 NOTE — Assessment & Plan Note (Signed)
Do surveillance colonoscopy at this time.  I have discussed the risks, alternatives, benefits with regards to but not limited to the risk of reaction to medication, bleeding, infection, perforation and the patient is agreeable to proceed. Written consent to be obtained.

## 2017-02-22 NOTE — Patient Instructions (Signed)
1. Colonoscopy as scheduled. See separate instructions.  

## 2017-02-22 NOTE — H&P (View-Only) (Signed)
Primary Care Physician:  Raylene Everts, MD  Primary Gastroenterologist:  Garfield Cornea, MD   Chief Complaint  Patient presents with  . Colonoscopy    due for 3 yr tcs, doing ok    HPI:  Rachel Gutierrez is a 61 y.o. female here to schedule surveillance colonoscopy.  Her last colonoscopy was August 2014 at which time she had sessile serrated adenomatous polyp removed near the ileocecal valve.  She was advised to come back in 3 years.  Clinically she is doing well.  Denies any GI symptoms.  Specifically bowel movements are regular.  No blood in the stool or melena.  No abdominal pain.  No upper GI symptoms.  Current Outpatient Medications  Medication Sig Dispense Refill  . acetaminophen (TYLENOL) 325 MG tablet Take 650 mg by mouth every 6 (six) hours as needed.     No current facility-administered medications for this visit.     Allergies as of 02/22/2017  . (No Known Allergies)    Past Medical History:  Diagnosis Date  . Hyperlipemia     Past Surgical History:  Procedure Laterality Date  . CARDIAC CATHETERIZATION     20 yrs ago  . COLONOSCOPY N/A 11/06/2012   Procedure: COLONOSCOPY;  Surgeon: Daneil Dolin, MD;  Location: [facility];  Service: Endoscopy;  Laterality: N/A;  7:30 AM  . MOUTH SURGERY     30 yrs ago    Family History  Problem Relation Age of Onset  . Cancer Mother        pancreatic  . Heart disease Father   . Thyroid disease Other   . Stroke Maternal Uncle   . CAD Maternal Uncle   . Cancer Maternal Uncle        esophageal  . Colon cancer Neg Hx     Social History   Socioeconomic History  . Marital status: Single    Spouse name: Not on file  . Number of children: 0  . Years of education: 58  . Highest education level: Bachelor's degree (e.g., BA, AB, BS)  Social Needs  . Financial resource strain: Not hard at all  . Food insecurity - worry: Never true  . Food insecurity - inability: Never true  . Transportation needs - medical: No  .  Transportation needs - non-medical: No  Occupational History  . Occupation: Estate manager/land agent  Tobacco Use  . Smoking status: Former Smoker    Packs/day: 0.25    Years: 2.00    Pack years: 0.50    Types: Cigarettes    Last attempt to quit: 03/22/1978    Years since quitting: 38.9  . Smokeless tobacco: Never Used  Substance and Sexual Activity  . Alcohol use: Yes    Comment: social  . Drug use: No  . Sexual activity: Not Currently  Other Topics Concern  . Not on file  Social History Narrative   Lives alone   Cares for aging family members   Gets plenty of exercise   Pet dog chow shepard mix      ROS:  General: Negative for anorexia, weight loss, fever, chills, fatigue, weakness. Eyes: Negative for vision changes.  ENT: Negative for hoarseness, difficulty swallowing , nasal congestion. CV: Negative for chest pain, angina, palpitations, dyspnea on exertion, peripheral edema.  Respiratory: Negative for dyspnea at rest, dyspnea on exertion, cough, sputum, wheezing.  GI: See history of present illness. GU:  Negative for dysuria, hematuria, urinary incontinence, urinary frequency, nocturnal urination.  MS: Negative for joint pain, low back pain.  Derm: Negative for rash or itching.  Neuro: Negative for weakness, abnormal sensation, seizure, frequent headaches, memory loss, confusion.  Psych: Negative for anxiety, depression, suicidal ideation, hallucinations.  Endo: Negative for unusual weight change.  Heme: Negative for bruising or bleeding. Allergy: Negative for rash or hives.    Physical Examination:  BP 133/85   Pulse 77   Temp 98.1 F (36.7 C) (Oral)   Ht 5\' 5"  (1.651 m)   Wt 192 lb 6.4 oz (87.3 kg)   BMI 32.02 kg/m    General: Well-nourished, well-developed in no acute distress.  Head: Normocephalic, atraumatic.   Eyes: Conjunctiva pink, no icterus. Mouth: Oropharyngeal mucosa moist and pink , no lesions erythema or exudate. Neck: Supple without  thyromegaly, masses, or lymphadenopathy.  Lungs: Clear to auscultation bilaterally.  Heart: Regular rate and rhythm, no murmurs rubs or gallops.  Abdomen: Bowel sounds are normal, nontender, nondistended, no hepatosplenomegaly or masses, no abdominal bruits or    hernia , no rebound or guarding.   Rectal: Deferred Extremities: No lower extremity edema. No clubbing or deformities.  Neuro: Alert and oriented x 4 , grossly normal neurologically.  Skin: Warm and dry, no rash or jaundice.   Psych: Alert and cooperative, normal mood and affect.    Imaging Studies: No results found.

## 2017-02-22 NOTE — Progress Notes (Signed)
Primary Care Physician:  Raylene Everts, MD  Primary Gastroenterologist:  Garfield Cornea, MD   Chief Complaint  Patient presents with  . Colonoscopy    due for 3 yr tcs, doing ok    HPI:  Rachel Gutierrez is a 61 y.o. female here to schedule surveillance colonoscopy.  Her last colonoscopy was August 2014 at which time she had sessile serrated adenomatous polyp removed near the ileocecal valve.  She was advised to come back in 3 years.  Clinically she is doing well.  Denies any GI symptoms.  Specifically bowel movements are regular.  No blood in the stool or melena.  No abdominal pain.  No upper GI symptoms.  Current Outpatient Medications  Medication Sig Dispense Refill  . acetaminophen (TYLENOL) 325 MG tablet Take 650 mg by mouth every 6 (six) hours as needed.     No current facility-administered medications for this visit.     Allergies as of 02/22/2017  . (No Known Allergies)    Past Medical History:  Diagnosis Date  . Hyperlipemia     Past Surgical History:  Procedure Laterality Date  . CARDIAC CATHETERIZATION     20 yrs ago  . COLONOSCOPY N/A 11/06/2012   Procedure: COLONOSCOPY;  Surgeon: Daneil Dolin, MD;  Location: [facility];  Service: Endoscopy;  Laterality: N/A;  7:30 AM  . MOUTH SURGERY     30 yrs ago    Family History  Problem Relation Age of Onset  . Cancer Mother        pancreatic  . Heart disease Father   . Thyroid disease Other   . Stroke Maternal Uncle   . CAD Maternal Uncle   . Cancer Maternal Uncle        esophageal  . Colon cancer Neg Hx     Social History   Socioeconomic History  . Marital status: Single    Spouse name: Not on file  . Number of children: 0  . Years of education: 80  . Highest education level: Bachelor's degree (e.g., BA, AB, BS)  Social Needs  . Financial resource strain: Not hard at all  . Food insecurity - worry: Never true  . Food insecurity - inability: Never true  . Transportation needs - medical: No  .  Transportation needs - non-medical: No  Occupational History  . Occupation: Estate manager/land agent  Tobacco Use  . Smoking status: Former Smoker    Packs/day: 0.25    Years: 2.00    Pack years: 0.50    Types: Cigarettes    Last attempt to quit: 03/22/1978    Years since quitting: 38.9  . Smokeless tobacco: Never Used  Substance and Sexual Activity  . Alcohol use: Yes    Comment: social  . Drug use: No  . Sexual activity: Not Currently  Other Topics Concern  . Not on file  Social History Narrative   Lives alone   Cares for aging family members   Gets plenty of exercise   Pet dog chow shepard mix      ROS:  General: Negative for anorexia, weight loss, fever, chills, fatigue, weakness. Eyes: Negative for vision changes.  ENT: Negative for hoarseness, difficulty swallowing , nasal congestion. CV: Negative for chest pain, angina, palpitations, dyspnea on exertion, peripheral edema.  Respiratory: Negative for dyspnea at rest, dyspnea on exertion, cough, sputum, wheezing.  GI: See history of present illness. GU:  Negative for dysuria, hematuria, urinary incontinence, urinary frequency, nocturnal urination.  MS: Negative for joint pain, low back pain.  Derm: Negative for rash or itching.  Neuro: Negative for weakness, abnormal sensation, seizure, frequent headaches, memory loss, confusion.  Psych: Negative for anxiety, depression, suicidal ideation, hallucinations.  Endo: Negative for unusual weight change.  Heme: Negative for bruising or bleeding. Allergy: Negative for rash or hives.    Physical Examination:  BP 133/85   Pulse 77   Temp 98.1 F (36.7 C) (Oral)   Ht 5\' 5"  (1.651 m)   Wt 192 lb 6.4 oz (87.3 kg)   BMI 32.02 kg/m    General: Well-nourished, well-developed in no acute distress.  Head: Normocephalic, atraumatic.   Eyes: Conjunctiva pink, no icterus. Mouth: Oropharyngeal mucosa moist and pink , no lesions erythema or exudate. Neck: Supple without  thyromegaly, masses, or lymphadenopathy.  Lungs: Clear to auscultation bilaterally.  Heart: Regular rate and rhythm, no murmurs rubs or gallops.  Abdomen: Bowel sounds are normal, nontender, nondistended, no hepatosplenomegaly or masses, no abdominal bruits or    hernia , no rebound or guarding.   Rectal: Deferred Extremities: No lower extremity edema. No clubbing or deformities.  Neuro: Alert and oriented x 4 , grossly normal neurologically.  Skin: Warm and dry, no rash or jaundice.   Psych: Alert and cooperative, normal mood and affect.    Imaging Studies: No results found.

## 2017-02-22 NOTE — Progress Notes (Signed)
CC'ED TO PCP 

## 2017-03-11 ENCOUNTER — Encounter (HOSPITAL_COMMUNITY): Payer: Self-pay | Admitting: *Deleted

## 2017-03-11 ENCOUNTER — Encounter (HOSPITAL_COMMUNITY)
Admission: RE | Disposition: A | Discharge status: Home / Self Care | Payer: Self-pay | Source: Ambulatory Visit | Attending: Internal Medicine

## 2017-03-11 ENCOUNTER — Other Ambulatory Visit: Payer: Self-pay

## 2017-03-11 ENCOUNTER — Ambulatory Visit (HOSPITAL_COMMUNITY)
Admission: RE | Admit: 2017-03-11 | Discharge: 2017-03-11 | Disposition: A | Discharge status: Home / Self Care | Payer: PRIVATE HEALTH INSURANCE | Source: Ambulatory Visit | Attending: Internal Medicine | Admitting: Internal Medicine

## 2017-03-11 DIAGNOSIS — Z86010 Personal history of colonic polyps: Secondary | ICD-10-CM | POA: Diagnosis not present

## 2017-03-11 DIAGNOSIS — Z1211 Encounter for screening for malignant neoplasm of colon: Secondary | ICD-10-CM | POA: Diagnosis not present

## 2017-03-11 DIAGNOSIS — Z8249 Family history of ischemic heart disease and other diseases of the circulatory system: Secondary | ICD-10-CM | POA: Insufficient documentation

## 2017-03-11 DIAGNOSIS — Z79899 Other long term (current) drug therapy: Secondary | ICD-10-CM | POA: Insufficient documentation

## 2017-03-11 DIAGNOSIS — K5730 Diverticulosis of large intestine without perforation or abscess without bleeding: Secondary | ICD-10-CM | POA: Diagnosis not present

## 2017-03-11 DIAGNOSIS — E785 Hyperlipidemia, unspecified: Secondary | ICD-10-CM | POA: Insufficient documentation

## 2017-03-11 DIAGNOSIS — Z87891 Personal history of nicotine dependence: Secondary | ICD-10-CM | POA: Diagnosis not present

## 2017-03-11 DIAGNOSIS — D126 Benign neoplasm of colon, unspecified: Secondary | ICD-10-CM

## 2017-03-11 HISTORY — PX: COLONOSCOPY: SHX5424

## 2017-03-11 SURGERY — COLONOSCOPY
Anesthesia: Moderate Sedation

## 2017-03-11 MED ORDER — MIDAZOLAM HCL 5 MG/5ML IJ SOLN
INTRAMUSCULAR | Status: AC
  Filled 2017-03-11: qty 10

## 2017-03-11 MED ORDER — ONDANSETRON HCL 4 MG/2ML IJ SOLN
INTRAMUSCULAR | Status: DC | PRN
  Administered 2017-03-11: 4 mg via INTRAVENOUS

## 2017-03-11 MED ORDER — MEPERIDINE HCL 100 MG/ML IJ SOLN
INTRAMUSCULAR | Status: DC | PRN
  Administered 2017-03-11 (×2): 50 mg via INTRAVENOUS

## 2017-03-11 MED ORDER — SODIUM CHLORIDE 0.9 % IV SOLN
INTRAVENOUS | Status: DC
  Administered 2017-03-11: 1000 mL via INTRAVENOUS

## 2017-03-11 MED ORDER — SIMETHICONE 40 MG/0.6ML PO SUSP
ORAL | Status: DC | PRN
  Administered 2017-03-11: 15 mL

## 2017-03-11 MED ORDER — MIDAZOLAM HCL 5 MG/5ML IJ SOLN
INTRAMUSCULAR | Status: DC | PRN
  Administered 2017-03-11 (×2): 2 mg via INTRAVENOUS

## 2017-03-11 MED ORDER — MEPERIDINE HCL 100 MG/ML IJ SOLN
INTRAMUSCULAR | Status: AC
  Filled 2017-03-11: qty 2

## 2017-03-11 MED ORDER — ONDANSETRON HCL 4 MG/2ML IJ SOLN
INTRAMUSCULAR | Status: AC
  Filled 2017-03-11: qty 2

## 2017-03-11 NOTE — Discharge Instructions (Signed)
Colonoscopy Discharge Instructions  Read the instructions outlined below and refer to this sheet in the next few weeks. These discharge instructions provide you with general information on caring for yourself after you leave the hospital. Your doctor may also give you specific instructions. While your treatment has been planned according to the most current medical practices available, unavoidable complications occasionally occur. If you have any problems or questions after discharge, call Dr. Gala Romney at [phone]. ACTIVITY  You may resume your regular activity, but move at a slower pace for the next 24 hours.   Take frequent rest periods for the next 24 hours.   Walking will help get rid of the air and reduce the bloated feeling in your belly (abdomen).   No driving for 24 hours (because of the medicine (anesthesia) used during the test).    Do not sign any important legal documents or operate any machinery for 24 hours (because of the anesthesia used during the test).  NUTRITION  Drink plenty of fluids.   You may resume your normal diet as instructed by your doctor.   Begin with a light meal and progress to your normal diet. Heavy or fried foods are harder to digest and may make you feel sick to your stomach (nauseated).   Avoid alcoholic beverages for 24 hours or as instructed.  MEDICATIONS  You may resume your normal medications unless your doctor tells you otherwise.  WHAT YOU CAN EXPECT TODAY  Some feelings of bloating in the abdomen.   Passage of more gas than usual.   Spotting of blood in your stool or on the toilet paper.  IF YOU HAD POLYPS REMOVED DURING THE COLONOSCOPY:  No aspirin products for 7 days or as instructed.   No alcohol for 7 days or as instructed.   Eat a soft diet for the next 24 hours.  FINDING OUT THE RESULTS OF YOUR TEST Not all test results are available during your visit. If your test results are not back during the visit, make an appointment  with your caregiver to find out the results. Do not assume everything is normal if you have not heard from your caregiver or the medical facility. It is important for you to follow up on all of your test results.  SEEK IMMEDIATE MEDICAL ATTENTION IF:  You have more than a spotting of blood in your stool.   Your belly is swollen (abdominal distention).   You are nauseated or vomiting.   You have a temperature over 101.   You have abdominal pain or discomfort that is severe or gets worse throughout the day.     You have diverticulosis in your colon. No polyp.  I recommend he have a repeat colonoscopy in 5 years.        Diverticulosis Diverticulosis is a condition that develops when small pouches (diverticula) form in the wall of the large intestine (colon). The colon is where water is absorbed and stool is formed. The pouches form when the inside layer of the colon pushes through weak spots in the outer layers of the colon. You may have a few pouches or many of them. What are the causes? The cause of this condition is not known. What increases the risk? The following factors may make you more likely to develop this condition:  Being older than age 73. Your risk for this condition increases with age. Diverticulosis is rare among people younger than age 87. By age 79, many people have it.  Eating a  low-fiber diet.  Having frequent constipation.  Being overweight.  Not getting enough exercise.  Smoking.  Taking over-the-counter pain medicines, like aspirin and ibuprofen.  Having a family history of diverticulosis.  What are the signs or symptoms? In most people, there are no symptoms of this condition. If you do have symptoms, they may include:  Bloating.  Cramps in the abdomen.  Constipation or diarrhea.  Pain in the lower left side of the abdomen.  How is this diagnosed? This condition is most often diagnosed during an exam for other colon problems. Because  diverticulosis usually has no symptoms, it often cannot be diagnosed independently. This condition may be diagnosed by:  Using a flexible scope to examine the colon (colonoscopy).  Taking an X-ray of the colon after dye has been put into the colon (barium enema).  Doing a CT scan.  How is this treated? You may not need treatment for this condition if you have never developed an infection related to diverticulosis. If you have had an infection before, treatment may include:  Eating a high-fiber diet. This may include eating more fruits, vegetables, and grains.  Taking a fiber supplement.  Taking a live bacteria supplement (probiotic).  Taking medicine to relax your colon.  Taking antibiotic medicines.  Follow these instructions at home:  Drink 6-8 glasses of water or more each day to prevent constipation.  Try not to strain when you have a bowel movement.  If you have had an infection before: ? Eat more fiber as directed by your health care provider or your diet and nutrition specialist (dietitian). ? Take a fiber supplement or probiotic, if your health care provider approves.  Take over-the-counter and prescription medicines only as told by your health care provider.  If you were prescribed an antibiotic, take it as told by your health care provider. Do not stop taking the antibiotic even if you start to feel better.  Keep all follow-up visits as told by your health care provider. This is important. Contact a health care provider if:  You have pain in your abdomen.  You have bloating.  You have cramps.  You have not had a bowel movement in 3 days. Get help right away if:  Your pain gets worse.  Your bloating becomes very bad.  You have a fever or chills, and your symptoms suddenly get worse.  You vomit.  You have bowel movements that are bloody or black.  You have bleeding from your rectum. Summary  Diverticulosis is a condition that develops when small  pouches (diverticula) form in the wall of the large intestine (colon).  You may have a few pouches or many of them.  This condition is most often diagnosed during an exam for other colon problems.  If you have had an infection related to diverticulosis, treatment may include increasing the fiber in your diet, taking supplements, or taking medicines. This information is not intended to replace advice given to you by your health care provider. Make sure you discuss any questions you have with your health care provider. Document Released: 12/04/2003 Document Revised: 01/26/2016 Document Reviewed: 01/26/2016 Elsevier Interactive Patient Education  2017 [employer].

## 2017-03-11 NOTE — Interval H&P Note (Signed)
History and Physical Interval Note:  03/11/2017 1:07 PM  Rachel Gutierrez  has presented today for surgery, with the diagnosis of h/o colon adenoma  The various methods of treatment have been discussed with the patient and family. After consideration of risks, benefits and other options for treatment, the patient has consented to  Procedure(s) with comments: COLONOSCOPY (N/A) - 1:45pm as a surgical intervention .  The patient's history has been reviewed, patient examined, no change in status, stable for surgery.  I have reviewed the patient's chart and labs.  Questions were answered to the patient's satisfaction.     Rachel Gutierrez  No change. Surveillance colonoscopy per plan.  The risks, benefits, limitations, alternatives and imponderables have been reviewed with the patient. Questions have been answered. All parties are agreeable.

## 2017-03-11 NOTE — Op Note (Signed)
[facility] Patient Name: Rachel Gutierrez Procedure Date: 03/11/2017 12:51 PM MRN: [id number] Date of Birth: [birth year] Attending MD: Norvel Richards , MD CSN: [id number] Age: 61 Admit Type: Ambulatory Procedure:                Colonoscopy Indications:              High risk colon cancer surveillance: Personal                            history of colonic polyps Providers:                Norvel Richards, MD, Charlsie Quest. Theda Sers RN, RN,                            Lurline Del, RN Referring MD:              Medicines:                Midazolam 4 mg IV, Meperidine 100 mg IV,                            Ondansetron 4 mg IV Complications:            No immediate complications. Estimated Blood Loss:     Estimated blood loss: none. Procedure:                Pre-Anesthesia Assessment:                           - Prior to the procedure, a History and Physical                            was performed, and patient medications and                            allergies were reviewed. The patient's tolerance of                            previous anesthesia was also reviewed. The risks                            and benefits of the procedure and the sedation                            options and risks were discussed with the patient.                            All questions were answered, and informed consent                            was obtained. Prior Anticoagulants: The patient has                            taken no previous anticoagulant or antiplatelet  agents. ASA Grade Assessment: II - A patient with                            mild systemic disease. After reviewing the risks                            and benefits, the patient was deemed in                            satisfactory condition to undergo the procedure.                           After obtaining informed consent, the colonoscope                            was passed under direct vision.  Throughout the                            procedure, the patient's blood pressure, pulse, and                            oxygen saturations were monitored continuously. The                            EC-[id number] ([id number]) scope was introduced through                            the anus and advanced to the the cecum, identified                            by appendiceal orifice and ileocecal valve. The                            colonoscopy was performed without difficulty. The                            patient tolerated the procedure well. The quality                            of the bowel preparation was adequate. The entire                            colon was well visualized. The ileocecal valve,                            appendiceal orifice, and rectum were photographed.                            The quality of the bowel preparation was adequate.                            The entire colon was well visualized. Scope In: 1:15:51 PM Scope Out: 1:25:15 PM Scope Withdrawal Time: 0 hours 6 minutes 55  seconds  Total Procedure Duration: 0 hours 9 minutes 24 seconds  Findings:      The perianal and digital rectal examinations were normal.      Multiple small and large-mouthed diverticula were found in the sigmoid       colon.      The exam was otherwise without abnormality on direct and retroflexion       views. Impression:               - Diverticulosis in the sigmoid colon.                           - The examination was otherwise normal on direct                            and retroflexion views.                           - No specimens collected. Moderate Sedation:      Moderate (conscious) sedation was administered by the endoscopy nurse       and supervised by the endoscopist. The following parameters were       monitored: oxygen saturation, heart rate, blood pressure, respiratory       rate, EKG, adequacy of pulmonary ventilation, and response to care.       Total physician  intraservice time was 16 minutes. Recommendation:           - Patient has a contact number available for                            emergencies. The signs and symptoms of potential                            delayed complications were discussed with the                            patient. Return to normal activities tomorrow.                            Written discharge instructions were provided to the                            patient.                           - Resume previous diet.                           - Continue present medications.                           - Repeat colonoscopy in 5 years for surveillance.                           - Return to GI clinic PRN. Procedure Code(s):        --- Professional ---  [phone], Colonoscopy, flexible; diagnostic, including                            collection of specimen(s) by brushing or washing,                            when performed (separate procedure)                           99152, Moderate sedation services provided by the                            same physician or other qualified health care                            professional performing the diagnostic or                            therapeutic service that the sedation supports,                            requiring the presence of an independent trained                            observer to assist in the monitoring of the                            patient's level of consciousness and physiological                            status; initial 15 minutes of intraservice time,                            patient age 75 years or older Diagnosis Code(s):        --- Professional ---                           Z86.010, Personal history of colonic polyps                           K57.30, Diverticulosis of large intestine without                            perforation or abscess without bleeding CPT copyright 2016 American Medical Association. All rights  reserved. The codes documented in this report are preliminary and upon coder review may  be revised to meet current compliance requirements. Cristopher Estimable. Stevon Gough, MD Norvel Richards, MD 03/11/2017 1:32:24 PM This report has been signed electronically. Number of Addenda: 0

## 2017-03-17 ENCOUNTER — Encounter (HOSPITAL_COMMUNITY): Payer: Self-pay | Admitting: Internal Medicine

## 2017-05-30 ENCOUNTER — Encounter: Payer: Self-pay | Admitting: Family Medicine

## 2017-08-19 ENCOUNTER — Ambulatory Visit: Payer: PRIVATE HEALTH INSURANCE | Admitting: Family Medicine

## 2018-03-02 ENCOUNTER — Other Ambulatory Visit (HOSPITAL_COMMUNITY): Payer: Self-pay | Admitting: General Practice

## 2018-03-02 DIAGNOSIS — Z1231 Encounter for screening mammogram for malignant neoplasm of breast: Secondary | ICD-10-CM

## 2018-03-17 ENCOUNTER — Encounter (HOSPITAL_COMMUNITY): Payer: Self-pay

## 2018-03-17 ENCOUNTER — Ambulatory Visit (HOSPITAL_COMMUNITY)
Admission: RE | Admit: 2018-03-17 | Discharge: 2018-03-17 | Disposition: A | Discharge status: Home / Self Care | Payer: PRIVATE HEALTH INSURANCE | Source: Ambulatory Visit | Attending: General Practice | Admitting: General Practice

## 2018-03-17 DIAGNOSIS — Z1231 Encounter for screening mammogram for malignant neoplasm of breast: Secondary | ICD-10-CM | POA: Diagnosis present

## 2018-03-23 ENCOUNTER — Ambulatory Visit (HOSPITAL_COMMUNITY): Payer: PRIVATE HEALTH INSURANCE

## 2018-03-30 ENCOUNTER — Other Ambulatory Visit (HOSPITAL_COMMUNITY): Payer: Self-pay | Admitting: General Practice

## 2018-03-30 DIAGNOSIS — R928 Other abnormal and inconclusive findings on diagnostic imaging of breast: Secondary | ICD-10-CM

## 2018-04-18 ENCOUNTER — Ambulatory Visit (HOSPITAL_COMMUNITY)
Admission: RE | Admit: 2018-04-18 | Discharge: 2018-04-18 | Disposition: A | Discharge status: Home / Self Care | Payer: PRIVATE HEALTH INSURANCE | Source: Ambulatory Visit | Attending: General Practice | Admitting: General Practice

## 2018-04-18 DIAGNOSIS — R928 Other abnormal and inconclusive findings on diagnostic imaging of breast: Secondary | ICD-10-CM

## 2021-03-04 ENCOUNTER — Other Ambulatory Visit (HOSPITAL_COMMUNITY): Payer: Self-pay | Admitting: Family Medicine

## 2021-03-04 DIAGNOSIS — Z1231 Encounter for screening mammogram for malignant neoplasm of breast: Secondary | ICD-10-CM

## 2021-04-20 ENCOUNTER — Other Ambulatory Visit: Payer: Self-pay

## 2021-04-20 ENCOUNTER — Ambulatory Visit (HOSPITAL_COMMUNITY)
Admission: RE | Admit: 2021-04-20 | Discharge: 2021-04-20 | Disposition: A | Discharge status: Home / Self Care | Payer: PRIVATE HEALTH INSURANCE | Source: Ambulatory Visit | Attending: Family Medicine | Admitting: Family Medicine

## 2021-04-20 DIAGNOSIS — Z1231 Encounter for screening mammogram for malignant neoplasm of breast: Secondary | ICD-10-CM | POA: Diagnosis not present

## 2022-03-04 IMAGING — MG MM DIGITAL SCREENING BILAT W/ TOMO AND CAD
8 series · 8 of 24 positions shown · non-contrast
Comparison: Previous exam(s).

CLINICAL DATA: Screening.

EXAM:
DIGITAL SCREENING BILATERAL MAMMOGRAM WITH TOMOSYNTHESIS AND CAD
TECHNIQUE: Bilateral screening digital craniocaudal and mediolateral oblique
mammograms were obtained. Bilateral screening digital breast
tomosynthesis was performed. The images were evaluated with
computer-aided detection.

[L MLO synth-2D]
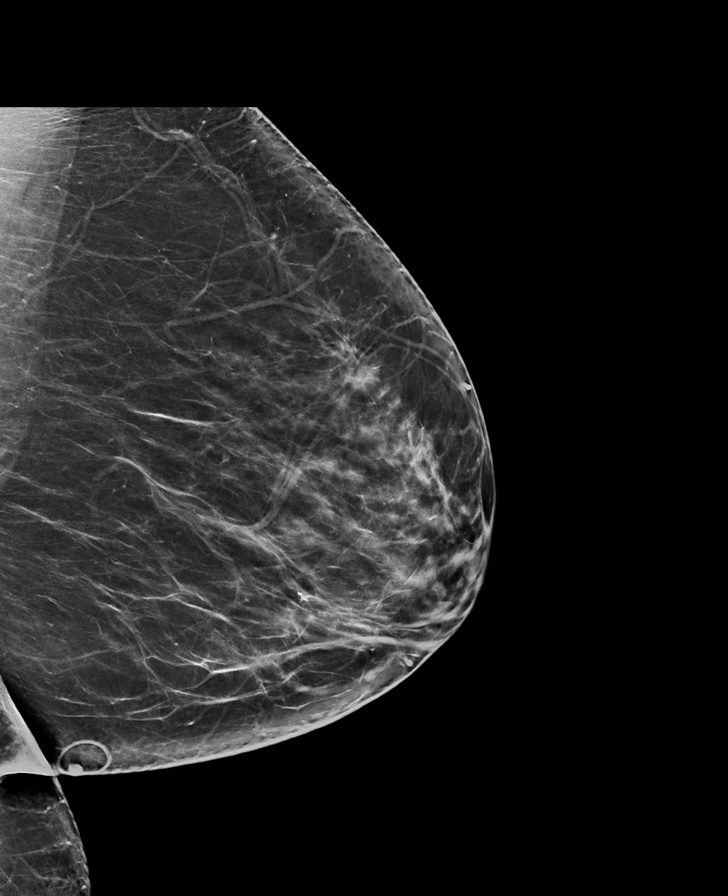

[R MLO synth-2D]
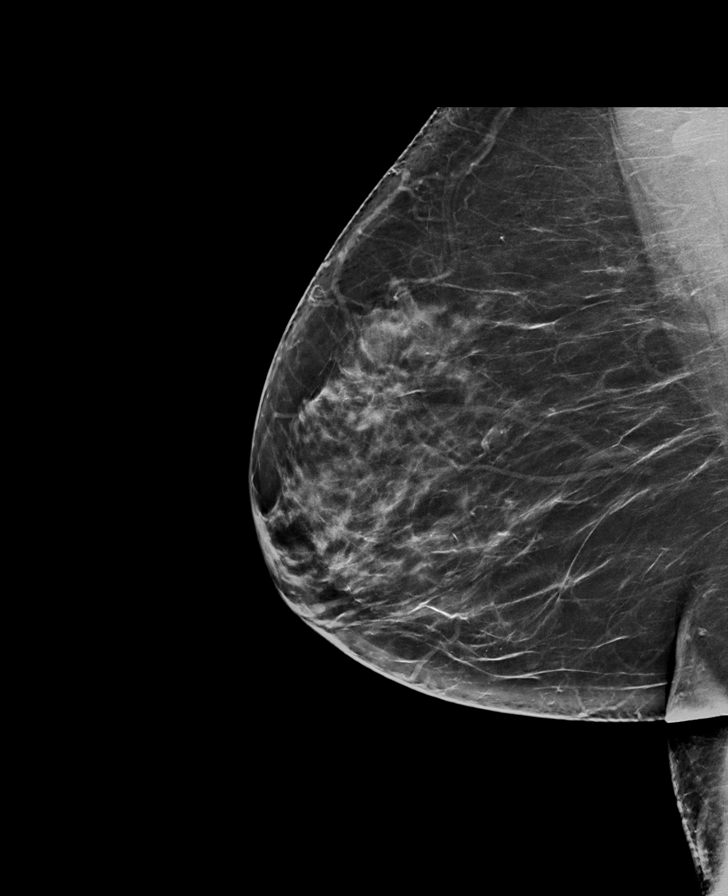

[R CC synth-2D]
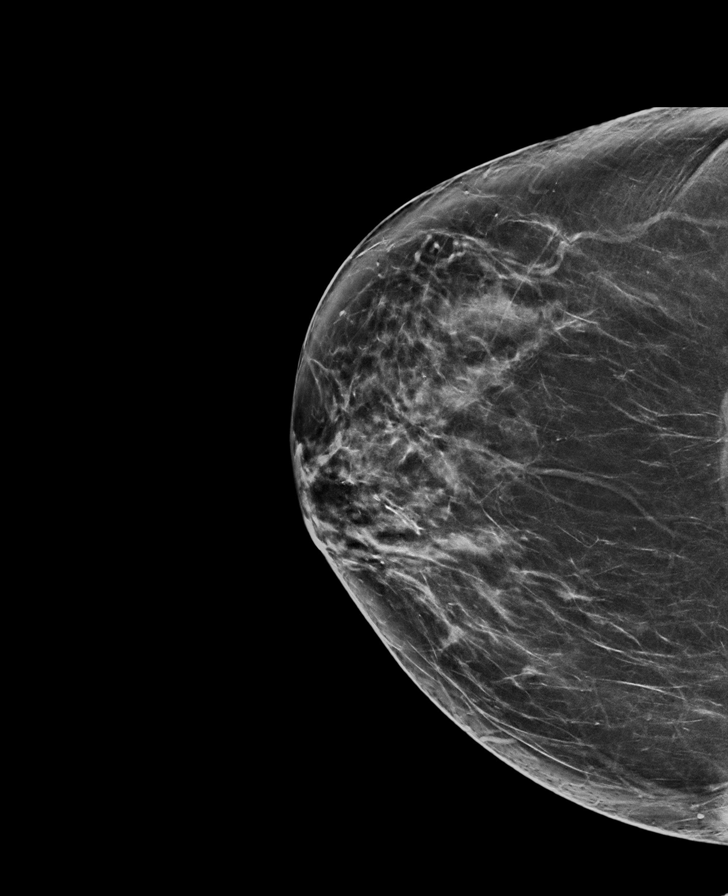

[L CC synth-2D]
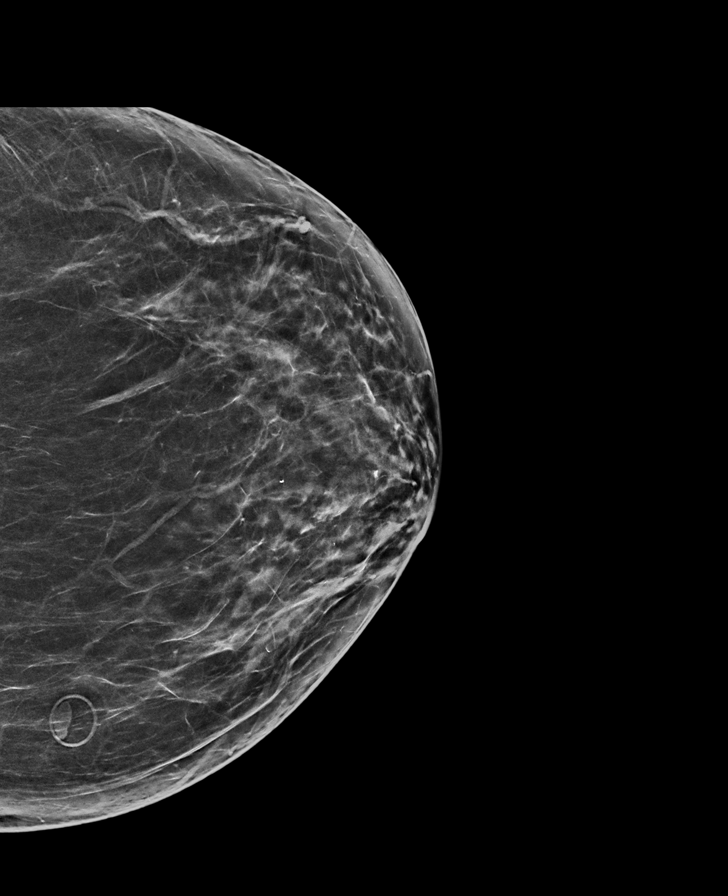

[L MLO tomo · tomo slice 40/79.0]
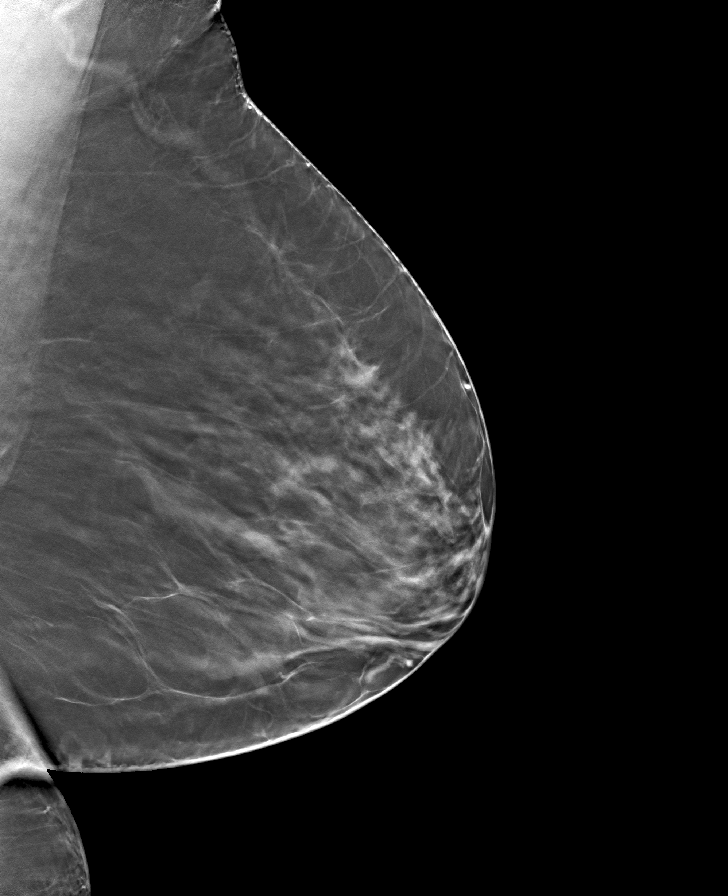

[R CC tomo · tomo slice 39/77.0]
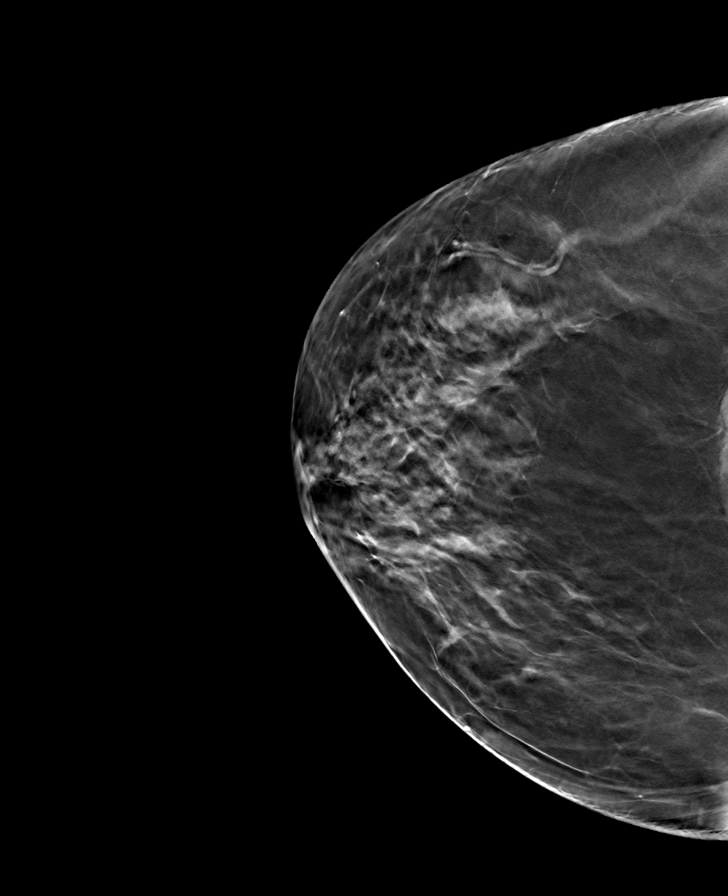

[L CC tomo · tomo slice 36/71.0]
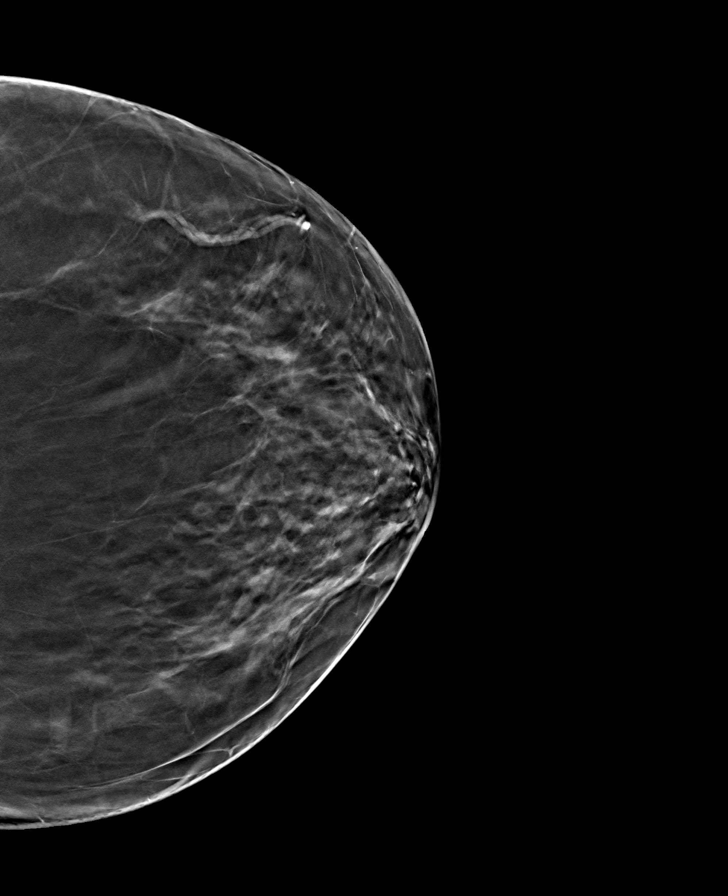

[R MLO tomo · tomo slice 41/81.0]
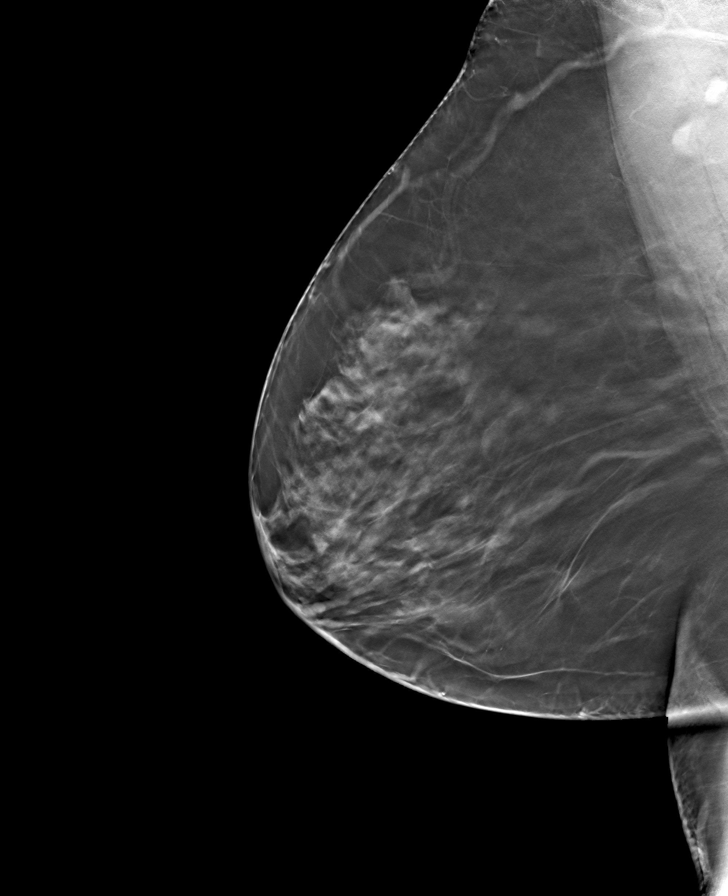

[8 of 24 positions shown; findings below may reference images not displayed]

ACR Breast Density Category b: There are scattered areas of
fibroglandular density.
FINDINGS: There are no findings suspicious for malignancy.
IMPRESSION: No mammographic evidence of malignancy. A result letter of this
screening mammogram will be mailed directly to the patient.

RECOMMENDATION:
Screening mammogram in one year. (Code:51-O-LD2)

BI-RADS CATEGORY  1: Negative.

## 2022-04-12 ENCOUNTER — Encounter: Payer: Self-pay | Admitting: *Deleted

## 2022-10-26 ENCOUNTER — Other Ambulatory Visit (HOSPITAL_COMMUNITY): Payer: Self-pay | Admitting: Family Medicine

## 2022-10-26 DIAGNOSIS — Z1231 Encounter for screening mammogram for malignant neoplasm of breast: Secondary | ICD-10-CM

## 2022-10-27 ENCOUNTER — Encounter: Payer: Self-pay | Admitting: *Deleted

## 2022-11-08 ENCOUNTER — Ambulatory Visit (HOSPITAL_COMMUNITY)
Admission: RE | Admit: 2022-11-08 | Discharge: 2022-11-08 | Disposition: A | Discharge status: Home / Self Care | Payer: No Typology Code available for payment source | Source: Ambulatory Visit | Attending: Family Medicine | Admitting: Family Medicine

## 2022-11-08 DIAGNOSIS — Z1231 Encounter for screening mammogram for malignant neoplasm of breast: Secondary | ICD-10-CM | POA: Insufficient documentation

## 2023-04-29 ENCOUNTER — Encounter (INDEPENDENT_AMBULATORY_CARE_PROVIDER_SITE_OTHER): Payer: Self-pay | Admitting: *Deleted

## 2024-03-05 ENCOUNTER — Other Ambulatory Visit (HOSPITAL_COMMUNITY): Payer: Self-pay | Admitting: Family Medicine

## 2024-03-05 DIAGNOSIS — Z1231 Encounter for screening mammogram for malignant neoplasm of breast: Secondary | ICD-10-CM

## 2024-04-09 ENCOUNTER — Ambulatory Visit (HOSPITAL_COMMUNITY)
Admission: RE | Admit: 2024-04-09 | Discharge: 2024-04-09 | Disposition: A | Discharge status: Home / Self Care | Payer: PRIVATE HEALTH INSURANCE | Source: Ambulatory Visit | Attending: Family Medicine | Admitting: Family Medicine

## 2024-04-09 DIAGNOSIS — Z1231 Encounter for screening mammogram for malignant neoplasm of breast: Secondary | ICD-10-CM | POA: Diagnosis present
